# Patient Record
Sex: Male | Born: 1950 | Race: White | Hispanic: No | Marital: Married
Health system: Northeastern US, Academic
[De-identification: ages and names within clinical notes are randomized; demographics above are authoritative.]

---

## 2015-10-08 ENCOUNTER — Ambulatory Visit

## 2018-02-28 ENCOUNTER — Ambulatory Visit

## 2018-03-22 ENCOUNTER — Ambulatory Visit

## 2018-03-22 NOTE — Progress Notes (Signed)
Visit Type:  Follow-up  Primary Provider:  Dr. Valetta Mole      History of Present Illness:  Jerry Mercer is a 67 year-old male with a past medical history of SVT s/p ablation in 1996 at [facility], patient states he has a history of afib post, vitamin B12 deficiency and white coat hypertension.     He was previously followed by Dr. Liane Comber but has not been seen in our office since 02/24/2015.     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     Patient denies any hospitalizations or new medical problems the past 3 years.    In February 2019 he noticed he was having an increase in his premature contractions, mostly occurring 1/2 hour after eating and sometimes lasting 2-3 hours. He adjusted his diet and added magnesium and fish oil daily and he felt that helped with the symptoms.    In September 2019 he started experiencing an increase in his premature ventricular contractions again, either happening before or after meals, can happen when he is hungry or after he has had something to eat. When he belches the symptoms improve, symptoms also improve with Tums. Sometimes he notices middle back pain during the PVC's.     He denies chest pain, palpitations, shortness of breath, PND or lower leg edema, lightheadedness, dizziness, near syncope, syncope, TIA or stroke like symptoms.     He is physically active. He walks 5 miles a day, goes to the gym and golfs - all without exertional symptoms.     Patient was recently seen by his primary care physician, Dr. Valetta Mole in [place], [place], and states his blood work was okay with the exception of his cholesterol levels. We do not have labs available. We are requesting them. The patient states that his good cholesterol was elevated.    He also notes that he has a longstanding history of white coat hypertension. His home blood pressures run anywhere from 107/80  to 142/92 with an average blood pressure of 110-120/70's-80's. During his most recent physician visit, his office blood pressure was 157/94.    His home machine has been tested and is accurate.    He had a recent EKG on 03/11/2018 at his physician's office which showed sinus rhythm 68 bpm, no ectopy, normal axis, intraventricular conduction delay.     Current Problems- Reviewed during today's visit  DRY EYE SYNDROME  EXAMINATION OF EYES AND VISION  LEUKOCYTOPENIA NOS  VITAMIN B12 DEFICIENCY BORDERLINE  ATRIAL FIBRILLATION SP RF ABLATION  ATRIAL ARRHYTHMIAS    Current Medications- Reviewed during today's visit  ATENOLOL 50 MG ORAL TABLET: 37.5 mg by mouth daily  ATIVAN 1 MG ORAL TABLET (LORAZEPAM): 1/2 tablet as needed before flying  VITAMIN B-12 100 MCG ORAL TABLET (CYANOCOBALAMIN): one  by mouth each day  VIAGRA 100 MG ORAL TABLET (SILDENAFIL CITRATE): 1/2 - 1 by mouth daily as needed 1 hour prior to intercourse  EQL FISH OIL CAPSULE (OMEGA-3 FATTY ACIDS CAPS):     Current Allergies- Reviewed during today's visit  NO KNOWN ALLERGIES    Family History  + fam h/x of macular degeneration    Risk Factors  Tobacco User: no  Smoking Status: never smoked  Drug use: no  Alcohol use: yes  Average drink(s) per day: social      Alcohol Comments:  3 drinks a week    Exercise:  Yes  Type: walks  Times per week: 7  Exercise Comments:  5 miles daily, golf, gym        Review of Systems   General: Denies fever, chills, sweats, anorexia, fatigue, weakness, malaise, weight loss.   Eyes: Denies visual change or blurring.   Cardiovascular: Denies chest pain or pressure, palpitations, shortness of breath, orthopnea, PND, near syncope or syncope, fatigue. Feels PVC's  Respiratory: Denies dry cough, productive cough, shortness of breath, wheezing.   Gastrointestinal: Denies abdominal pain.   Musculoskeletal: Denies muscle cramps or aches.   Skin: Denies rash.   Heme/Lymphatic: Denies unusual bleeding.     Vital Signs     Patient: 67 Years  Old Male  Height:  70.5 in.  Weight: 190 lbs      Wt Chg: 3 since 02/24/2015  BMI:  26.97        26.55 on 02/24/2015  BP:  173/90     142/78 on 02/24/2015   Pulse:  76         Resp:  18  Pulse Ox: 98 %    Medications and Allergies Reviewed          Physical Exam    General:      well developed, well nourished, in no acute distress.    Neck:      no bruit, no JVD  Lungs:      clear bilaterally to auscultation.    Heart:      non-displaced PMI, chest non-tender; regular rate and rhythm, S1, S2 without murmurs, rubs, or gallops  Abdomen:       normal bowel sounds; no hepatosplenomegaly no ventral,umbilical hernias or masses noted.    Pulses:      pulses normal in all 4 extremities.    Extremities:      no clubbing, cyanosis, edema, or deformity noted with normal full range of motion of all joints.    Neurologic:      no focal deficits  Skin:      intact without lesions or rashes.    Psych:      alert and cooperative; normal mood and affect; normal attention span and concentration.      Test Results:     WBC:     5.5    (02/09/2015)  Hemoglobin:    15.6    (02/09/2015)  Hematocrit:    47.1    (02/09/2015)  MCV:     86.9    (02/09/2015)  MCH:     28.8    (02/09/2015)  PLatelets:    182    (02/09/2015)  Vitamin B12:    665    (02/09/2015)  TSH Ult:    1.31    (02/09/2015)  Total Prot:    7.1    (02/09/2015)  Albumin:    4.7    (02/09/2015)  AST:     19    (02/09/2015)  ALT:     18    (02/09/2015)  ALK PHOS:    89    (02/09/2015)  Total Bilirubin:   0.7    (02/09/2015)  Total Cholesterol:   251    (02/09/2015)  LDL:     148    (02/09/2015)  HDL:     82    (02/09/2015)  Non HDL:    169    02/09/2015)  Triglycerides:    106    (02/09/2015)  Cardiac CRP:    0.7 mg/L    (01/29/2014)  Glucose:  96    (02/09/2015)  Sodium:    142    (02/09/2015)  Potassium:    4.2    (02/09/2015)  Chloride:    102    (02/09/2015)  Bicarbonate:    25    (02/09/2015)  BUN:     20    (02/09/2015)  Creatinine:    1.1    (02/09/2015)  eGFR:     68     (02/09/2015)  Serum Calcium:   9.7    (02/09/2015)  Colonoscopy:    Complete    (08/19/2012)  PSA:     2.12    (01/29/2014)       Assessment and Plan:     1.  Premature ventricular contractions  The patient's heart rate on examination was regular with an occasional skipped beat. Denies palpitations, lightheadedness, dizziness or syncope. Patient had recent blood work.   -Requesting recent blood work  -MCOT 2 week monitor to evaluate his ectopy.   -He should continue on Atenolol 37.5 mg daily for now.     2.  White coat hypertension  Blood pressure elevated today in the office 173/90. Average blood pressure at home 110-120/70's-80's. Blood pressure cuff was correlating at last MD visit.     KM/ndp    Med Compliance and SE's: Pt is compliant with meds with no side effects   Patient/Caregiver understand instructions and plan.    Medications Removed Today:   ASPIRIN 325 MG ORAL TABLET (ASPIRIN) take one by mouth daily  RESTASIS 0.05 % OPHTHALMIC EMULSION (CYCLOSPORINE) one drop ou two times daily  supplly three months; Route: OPHTHALMIC    Changes to Medication List Documented Today:   ATENOLOL 50 MG ORAL TABLET (ATENOLOL) 37.5 mg by mouth daily  EQL FISH OIL CAPSULE (OMEGA-3 FATTY ACIDS CAPS)             Patient was seen Independently by Bary Richard NP. Dr. Meredith Leeds was immediately available during encounter.

## 2018-04-06 ENCOUNTER — Ambulatory Visit

## 2018-04-11 ENCOUNTER — Ambulatory Visit

## 2018-04-17 ENCOUNTER — Ambulatory Visit: Admitting: Nurse Practitioner

## 2018-04-17 NOTE — Telephone Encounter (Signed)
Phone Note -     Outgoing Call    Initial call taken by: Bary Richard NP,  April 17, 2018 5:31 PM  Call placed to: Patient  Detail: MCT results provided  Summary of Call: MCT: unifocal PVC's, no afib or sustained beats. Okay for pt to trial atenolol to 50 mg a day and f/u with Dr/ Turissini in 2 weeks.     Action Taken: Phone Call Completed

## 2018-05-01 ENCOUNTER — Ambulatory Visit

## 2018-05-01 NOTE — Progress Notes (Signed)
Primary Provider:  Dr. Valetta Mole      History of Present Illness:  He returns for followup of symptomatic ventricular ectopy. He saw my nurse practitioner last month and was having more symptomatic PVC's. We increased Atenolol to 50 mg daily and he has been on magnesium since February when this started happening. It has been cyclical and seems to be related to meals.       Current Problems- Reviewed during today's visit  DRY EYE SYNDROME  EXAMINATION OF EYES AND VISION  LEUKOCYTOPENIA NOS  VITAMIN B12 DEFICIENCY BORDERLINE  ATRIAL FIBRILLATION SP RF ABLATION  ATRIAL ARRHYTHMIAS    Current Medications- Reviewed during today's visit  METOPROLOL SUCCINATE ER 50 MG ORAL TABLET EXTENDED RELEASE 2: 1 by mouth each day  ATIVAN 1 MG ORAL TABLET (LORAZEPAM): 1/2 tablet as needed before flying  VITAMIN B-12 100 MCG ORAL TABLET (CYANOCOBALAMIN): one  by mouth each day  VIAGRA 100 MG ORAL TABLET (SILDENAFIL CITRATE): 1/2 - 1 by mouth daily as needed 1 hour prior to intercourse  EQL FISH OIL CAPSULE (OMEGA-3 FATTY ACIDS CAPS):     Current Allergies- Reviewed during today's visit  NO KNOWN ALLERGIES    Family History  + fam h/x of macular degeneration    Risk Factors  Smoking Status: never smoked  Drug use: no  Alcohol use: yes  Average drink(s) per day: social      Alcohol Comments:  3 drinks a week    Exercise: Yes  Type: walks  Times per week: 7  Exercise Comments:  5 miles daily, golf, gym          Vital Signs     Patient: 67 Years Old Male  Height:  70.5 in.  Prev Weight:  190 lbs   BMI: 26.97 on 03/22/2018   BP:  136/90     173/90 on 03/22/2018   Pulse:  84         Pulse Ox: 98 %    Medications and Allergies Reviewed          Physical Exam    Neck:      +2 carotids, no bruits.   Lungs:      clear.  Heart:      regular rhythm, S1, S2, no murmurs appreciated.   Extremities:      no edema.          Assessment and Plan:     Symptomatic PVC's. The patient had a 2 week MCOT. He had symptomatic PVC's. His symptoms correlated  with PVC's on the monitor. On different days they are cyclical. Some days he has very few PVC's but other days he had over 500. I had a long discussion with the patient. I will try switching him to metoprolol 50 mg daily for a little more membrane stabilization and perhaps better 24 hour coverage. If that doesn't work, we can do Flecainide. With Flecainide I would want to do a stress test first. He is going to [place] and will return in 6 months. I will see him back when he returns.     CJT/ndp       Changes to Medication List Documented Today:   METOPROLOL SUCCINATE ER 50 MG ORAL TABLET EXTENDED RELEASE 2 (METOPROLOL SUCCINATE) 1 by mouth each day; Route: ORAL        Medications:  METOPROLOL SUCCINATE ER 50 MG ORAL TABLET EXTENDED RELEASE 2 (METOPROLOL SUCCINATE) 1 by mouth each day  #90[Tablet] x 3   Route:ORAL  Entered and Authorized by: Alford Highland MD   Signed by: Alford Highland MD on 05/01/2018   Method used: Electronically to      [id number] (retail)     [address]     [place], [place]  [address]     Ph: [phone]     Fax: [phone]   Note to Pharmacy: Route: ORAL;    RxID: [id number]

## 2018-05-03 ENCOUNTER — Ambulatory Visit

## 2018-05-03 NOTE — Telephone Encounter (Signed)
Phone Note -     Patient    Call back at Cell [phone]  Initial call taken by: Mignon Pine,  May 03, 2018 11:43 AM  Initial Details of Call:  pt was here wed and he and you discussed stopping Atenolol and starting Propanolol. when he picked up med at pharmacy it was Metoprolol.  he's not sure what he should be taking      Follow-up #1  Details: discussed with him  hte medicine is metoprolol  By: Alford Highland MD ~ May 03, 2018 6:54 PM

## 2018-12-05 ENCOUNTER — Ambulatory Visit

## 2018-12-05 NOTE — Progress Notes (Signed)
 Primary Provider:  Dr. Valetta Mole      History of Present Illness:  Jerry Mercer is a 68 year-old male with a past medical history of SVT s/p ablation in 1996 at [facility], patient states he has a history of afib post, vitamin B12 deficiency and white coat hypertension.     He was previously followed by Dr. Liane Comber but has not been seen in our office since 02/24/2015.     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     Patient denies any hospitalizations or new medical problems the past 3 years.    In February 2019 he noticed he was having an increase in his premature contractions, mostly occurring 1/2 hour after eating and sometimes lasting 2-3 hours. He adjusted his diet and added magnesium and fish oil daily and he felt that helped with the symptoms.    In September 2019 he started experiencing an increase in his premature ventricular contractions again, either happening before or after meals, can happen when he is hungry or after he has had something to eat. When he belches the symptoms improve, symptoms also improve with Tums. Sometimes he notices middle back pain during the PVC's.     He returns for followup of palpitations. He has a history of SVT s/p ablation and PAC's. He was switched from Atenolol to metoprolol and his palpitations are much better. He only gets a few months of fluttering intermittently for a few minutes, then it resolves. He has noted in the past 6 months that his blood pressure has gone up. It used to be in the 110-120 range and now runs between 136/90 to as high as 155-158/100 on his [employer] home machine. Pulse is usually 70-80. He drinks alcohol 2-3x/week, 2-3 drinks. He is not exercising as much as he used to due to the pandemic but was exercising before on an elliptical machine and treadmill. With that he noticed that his blood pressure improves. His weight is 191  pounds which is up a little bit. A few years ago his weight was in the mid-180's.       Current Problems- Reviewed during today's visit  DRY EYE SYNDROME (ICD10-H04.129)  EXAMINATION OF EYES AND VISION (ICD10-Z01.00)  LEUKOCYTOPENIA NOS (ICD10-D72.819)  VITAMIN B12 DEFICIENCY BORDERLINE (ICD10-E53.8)  ATRIAL FIBRILLATION SP RF ABLATION (ICD10-I48.91)  ATRIAL ARRHYTHMIAS (ICD10-I49.9)    Current Medications- Reviewed during today's visit  METOPROLOL SUCCINATE ER 50 MG ORAL TABLET EXTENDED RELEASE 2: 1 by mouth each day  ATIVAN 1 MG ORAL TABLET (LORAZEPAM): 1/2 tablet as needed before flying  VITAMIN B-12 100 MCG ORAL TABLET (CYANOCOBALAMIN): one  by mouth each day  VIAGRA 100 MG ORAL TABLET (SILDENAFIL CITRATE): 1/2 - 1 by mouth daily as needed 1 hour prior to intercourse    Current Allergies- Reviewed during today's visit  NO KNOWN ALLERGIES    Family History  + fam h/x of macular degeneration    Risk Factors  Smoking Status: never smoked  Drug use: no  Alcohol use: yes  Average drink(s) per day: social      Alcohol Comments:  3 drinks a week    Exercise: Yes  Type: walks  Times per week: 7  Exercise Comments:  5 miles daily, golf, gym          Vital Signs     Patient: 68 Years Old Male  Height:  70.5 in.  Weight: 190 lbs        BMI:  26.97        26.97 on 03/22/2018  BP:  145/94     136/90 on 05/01/2018   Temp:  98.1  F      Pulse:  72         Pulse Ox: 98 %    Medications and Allergies Reviewed            Physical Exam    Neck:      +2 carotids, full upstrokes, no bruits.   Lungs:      clear.  Heart:      regular rhythm, S1, S2   Extremities:      no edema.          Assessment and Plan:     1.  Palpitations. The patient has improved on metoprolol. He had an MCOT which showed no significant arrhythmias 6 months ago. Continue metoprolol.   2.  Hypertension. The patient will try to lose 5 pounds and exercise more. I went over a low salt diet with him including a 1 mg sodium/calorie ratio. He will call me in 2 months  with blood pressure readings. If they have not improved, will start him on an antihypertensive.     I will see him back in 4 months before he goes to [place].     CJT/ndp     cc:  Dr. Valetta Mole, [place], [place]      Medications Removed Today:   EQL FISH OIL CAPSULE (OMEGA-3 FATTY ACIDS CAPS)

## 2019-04-07 ENCOUNTER — Ambulatory Visit

## 2019-04-07 NOTE — Progress Notes (Signed)
 Primary Provider:  Dr. Valetta Mole      History of Present Illness:  He returns for followup of ventricular ectopy with symptomatic palpitations. He has a history of SVT in the past s/p ablation and a history of afib at the time of SVT ablation, then 1 other episode about a year later. With afib he was very symptomatic and felt unwell. He has had no problems since. He has had elevated blood pressure readings, mostly white coat hypertension. He brings in blood pressure readings from home. He has 30 readings, 2 were elevated just mildly, one was 140/92 and another was 145/88. The remainder were all in the normal range from a low of 108 systolic to a high of 128-130 systolic. He feels well. On metoprolol he has had minimal palpitations, about 90-95% improved. He plays pickle ball up to 4 hours in the hot sun and sometimes has elevated heart rate readings at about 101-110 but no error message on his machine. 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 He feels he may be dehydrated. These pickle ball games go on for 4 hours, sometimes in the hot sun. No chest pain, shortness of breath. He brings in blood work from his physician which shows cholesterol 218 and LDL of 134.       Current Problems- Reviewed during today's visit  DRY EYE SYNDROME (ICD10-H04.129)  EXAMINATION OF EYES AND VISION (ICD10-Z01.00)  LEUKOCYTOPENIA NOS (ICD10-D72.819)  VITAMIN B12 DEFICIENCY BORDERLINE (ICD10-E53.8)  ATRIAL FIBRILLATION SP RF ABLATION (ICD10-I48.91)  ATRIAL ARRHYTHMIAS (ICD10-I49.9)    Current Medications- Reviewed during today's visit  METOPROLOL SUCCINATE ER 50 MG ORAL TABLET EXTENDED RELEASE 2: 1 by mouth each day  ATIVAN 1 MG ORAL TABLET (LORAZEPAM): 1/2 tablet as needed before flying  VITAMIN B-12 100 MCG ORAL TABLET (CYANOCOBALAMIN): one  by mouth each day  VIAGRA 100 MG ORAL TABLET (SILDENAFIL CITRATE): 1/2 - 1 by mouth daily as needed 1 hour prior to intercourse    Current Allergies-  Reviewed during today's visit  NO KNOWN ALLERGIES    Family History  + fam h/x of macular degeneration    Risk Factors  Smoking Status: never smoked  Drug use: no  Alcohol use: yes  Average drink(s) per day: social      Alcohol Comments:  3 drinks a week    Exercise: Yes  Type: walks  Times per week: 7  Exercise Comments:  5 miles daily, golf, gym          Vital Signs     Patient: 68 Years Old Male  Height:  70.5 in.  Weight: 182 lbs      Wt Chg: -8 since 12/05/2018  BMI:  25.84        26.97 on 12/05/2018  BP:  156/88     145/94 on 12/05/2018   Temp:  97.11  F      Pulse:  80       regular    Medications and Allergies Reviewed            Physical Exam    Neck:      +2 carotids, no bruits.   Lungs:      clear.  Heart:      regular rhythm, S1, S2, no murmurs appreciated.   Extremities:      no edema.          Assessment and Plan:     1.  Hypertension.  The patient's blood pressures at home are > 90% in the normal range. There is no need to increase his antihypertensive.  2.  Palpitations. Markedly improved with metoprolol. He is tolerating it well without side effects.  3.  History of atrial fibrillation. The patient has had no symptomatic recurrence. His elevated readings after pickle ball are probably sinus. Recommend he stays more hydrated.    I will see him back in 1 year, sooner if any symptoms warrant.     CJT/ndp     cc:  Dr. Valetta Mole, [place], [place]

## 2019-04-30 ENCOUNTER — Ambulatory Visit

## 2019-10-17 ENCOUNTER — Ambulatory Visit

## 2020-04-06 ENCOUNTER — Ambulatory Visit

## 2020-04-06 NOTE — Progress Notes (Signed)
 [facility]   [address]  [place], [place] [address]  Office: [phone] Fax: [phone]  Active Medication List and Instructions  April 06, 2020     Jerry Mercer  [birth year]    1)  METOPROLOL SUCCINATE ER 50 MG ORAL TABLET EXTENDED RELEASE 2 (METOPROLOL SUCCINATE) 1 by mouth each day; Route: ORAL  2)  ATIVAN 1 MG ORAL TABLET (LORAZEPAM) 1/2 tablet as needed before flying  3)  VITAMIN B-12 100 MCG ORAL TABLET (CYANOCOBALAMIN) one  by mouth each day  4)  VIAGRA 100 MG ORAL TABLET (SILDENAFIL CITRATE) 1/2 - 1 by mouth daily as needed 1 hour prior to intercourse; Route: ORAL    Allergies:    Abbreviations:    bid= twice a day   Q= per  D= day    QD= per day  HS= bed time   QID= 4 times a day  OTC= over the counter  QOD= every other day  PO= by mouth   TID= three times a day  PRN= as needed   TID= 3 times a day

## 2020-04-07 ENCOUNTER — Ambulatory Visit

## 2020-04-07 NOTE — Progress Notes (Signed)
 Office Visit    04/07/2020  [facility]     Lamarr Lulas, MD  Cardiology   Progress Notes  Lamarr Lulas, MD (Physician) ? ? Cardiology     ?  [facility] Office Visit  ?  ?  History of Present Illness:  ?  Jerry Mercer is a 69 y.o. male with a past medical history of SVT s/p ablation in 1996 at [facility], patient states he has a history of afib post, vitamin B12 deficiency and white coat hypertension.   ?  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   ?  Patient denies any hospitalizations or new medical problems the past 3 years.  ?  In February 2019 he noticed he was having an increase in his premature contractions, mostly occurring 1/2 hour after eating and sometimes lasting 2-3 hours. He adjusted his diet and added magnesium and fish oil daily and he felt that helped with the symptoms.  ?  In September 2019 he started experiencing an increase in his premature ventricular contractions again, either happening before or after meals, can happen when he is hungry or after he has had something to eat. When he belches the symptoms improve, symptoms also improve with Tums. Sometimes he notices middle back pain during the PVC's.   ?  Since being placed on metoprolol he is really had really no palpitations to speak of.  Maybe once a month he has some palpitations not like multiple times a day before metoprolol.  ?  He had a difficult year.  He is diagnosed with having polymyalgia rheumatica and urinary retention when sciatica due to a spinal cyst which was drained.  He is finally doing better.  ?  He takes his blood pressures at home he has occasional high readings very rarely over 140 mostly ranges between 119-134/83-84  ?  Vital signs:  BP 134/76   Pulse 84   Ht 179.1 cm (5' 10.5")   Wt 86.2 kg (190 lb)   SpO2 98%   BMI 26.88 kg/m?   ?  Wt Readings from  Last 3 Encounters:    04/07/20  86.2 kg (190 lb)    ?  Physical Exam:  Neck: +2 carotids.  No bruits  Heart: Regular rhythm S1-2 no murmurs appreciated  Lungs: Clear  Extremities no edema  ?  EKG: I have personally reviewed the EKG from 04/07/2020 which shows normal sinus rhythm rate of 80 essentially normal ECG  ?  ?  Assessment/Plan:  ?  Paroxysmal atrial fibrillation  He is status post PVI.  No recurrent A. fib.  ?  PVC's (premature ventricular contractions)  Markedly improved metoprolol continue same medications see him back 1 year  ?  Primary hypertension  ?  Blood pressure at home is mostly controlled.  Only has a rare elevated reading.  He has whitecoat hypertension  ?  ?  Medications:  Patient's Medications    New Prescriptions    ?  No medications on file    Previous Medications    ?  ASPIRIN 325 MG TABLET     Take 325 mg by mouth daily.     ?  CYANOCOBALAMIN (VIT B-12) 1000 MCG TABLET     Take 100 mcg by mouth daily.    ?  LORAZEPAM (ATIVAN) 1 MG TABLET     Take 0.5 mg by mouth every  6 (six) hours as needed for anxiety.     ?  METOPROLOL SUCCINATE (TOPROL-XL) 50 MG 24 HR TABLET     Take 50 mg by mouth daily.    ?  PREDNISONE (DELTASONE) 1 MG TABLET     Take 6 mg by mouth daily with breakfast.    ?  SILDENAFIL (VIAGRA) 100 MG TABLET     Take 100 mg by mouth daily as needed for erectile dysfunction.    ?  TAMSULOSIN (FLOMAX) 0.4 MG CAP     Take 0.4 mg by mouth daily.    ?  TRAMADOL (ULTRAM) 50 MG TABLET     Take 50 mg by mouth every 6 (six) hours as needed for pain (specific location in comments).    Modified Medications    ?  No medications on file    Discontinued Medications    ?  ATENOLOL (TENORMIN) 50 MG TABLET     Take 50 mg by mouth daily.    ?  CYCLOSPORINE (RESTASIS) 0.05 % SUSPENSION     Place 1 drop into each eye 2 (two) times a day.    ?  ?  Follow up:  No follow-ups on file.  ?  Scarbro Palau Turissini, MD  ?  ?       I ?PHS AMB PRE VISIT PLANNING       B   None  Medication Changes            Patient not taking:  Reported on 04/07/2020     Medication List   Visit Diagnoses       Paroxysmal atrial fibrillation     PVC's (premature ventricular contractions)     Primary hypertension     Polymyalgia rheumatica     Urinary retention due to benign prostatic hyperplasia     Spinal cord cysts    Visit note  prepared and signed by Alford Highland, M.D. and uploaded to EMR        Problem List

## 2020-09-07 NOTE — Progress Notes (Signed)
Primary Provider:  Dr. Valetta Mole      History of Present Illness:  He returns for followup of symptomatic ventricular ectopy. He saw my nurse practitioner last month and was having more symptomatic PVC's. We increased Atenolol to 50 mg daily and he has been on magnesium since February when this started happening. It has been cyclical and seems to be related to meals.       Current Problems- Reviewed during today's visit  DRY EYE SYNDROME  EXAMINATION OF EYES AND VISION  LEUKOCYTOPENIA NOS  VITAMIN B12 DEFICIENCY BORDERLINE  ATRIAL FIBRILLATION SP RF ABLATION  ATRIAL ARRHYTHMIAS    Current Medications- Reviewed during today's visit  METOPROLOL SUCCINATE ER 50 MG ORAL TABLET EXTENDED RELEASE 2: 1 by mouth each day  ATIVAN 1 MG ORAL TABLET (LORAZEPAM): 1/2 tablet as needed before flying  VITAMIN B-12 100 MCG ORAL TABLET (CYANOCOBALAMIN): one  by mouth each day  VIAGRA 100 MG ORAL TABLET (SILDENAFIL CITRATE): 1/2 - 1 by mouth daily as needed 1 hour prior to intercourse  EQL FISH OIL CAPSULE (OMEGA-3 FATTY ACIDS CAPS):     Current Allergies- Reviewed during today's visit  NO KNOWN ALLERGIES    Family History  + fam h/x of macular degeneration    Risk Factors  Smoking Status: never smoked  Drug use: no  Alcohol use: yes  Average drink(s) per day: social      Alcohol Comments:  3 drinks a week    Exercise: Yes  Type: walks  Times per week: 7  Exercise Comments:  5 miles daily, golf, gym          Vital Signs     Patient: 70 Years Old Male  Height:  70.5 in.  Prev Weight:  190 lbs   BMI: 26.97 on 03/22/2018   BP:  136/90     173/90 on 03/22/2018   Pulse:  84         Pulse Ox: 98 %    Medications and Allergies Reviewed          Physical Exam    Neck:      +2 carotids, no bruits.   Lungs:      clear.  Heart:      regular rhythm, S1, S2, no murmurs appreciated.   Extremities:      no edema.          Assessment and Plan:     Symptomatic PVC's. The patient had a 2 week MCOT. He had symptomatic PVC's. His symptoms correlated  with PVC's on the monitor. On different days they are cyclical. Some days he has very few PVC's but other days he had over 500. I had a long discussion with the patient. I will try switching him to metoprolol 50 mg daily for a little more membrane stabilization and perhaps better 24 hour coverage. If that doesn't work, we can do Flecainide. With Flecainide I would want to do a stress test first. He is going to [place] and will return in 6 months. I will see him back when he returns.     CJT/ndp       Changes to Medication List Documented Today:   METOPROLOL SUCCINATE ER 50 MG ORAL TABLET EXTENDED RELEASE 2 (METOPROLOL SUCCINATE) 1 by mouth each day; Route: ORAL        Medications:  METOPROLOL SUCCINATE ER 50 MG ORAL TABLET EXTENDED RELEASE 2 (METOPROLOL SUCCINATE) 1 by mouth each day  #90[Tablet] x 3   Route:ORAL  Entered and Authorized by: Alford Highland MD   Signed by: Alford Highland MD on 05/01/2018   Method used: Electronically to      [id number] (retail)     [address]     [place], [place]  [address]     Ph: [phone]     Fax: [phone]   Note to Pharmacy: Route: ORAL;    RxID: [id number]

## 2020-09-07 NOTE — Progress Notes (Signed)
Visit Type:  Follow-up  Primary Provider:  Dr. Valetta Mole      History of Present Illness:  Jerry Mercer is a 70 year-old male with a past medical history of SVT s/p ablation in 1996 at [facility], patient states he has a history of afib post, vitamin B12 deficiency and white coat hypertension.     He was previously followed by Dr. Liane Comber but has not been seen in our office since 02/24/2015.     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     Patient denies any hospitalizations or new medical problems the past 3 years.    In February 2019 he noticed he was having an increase in his premature contractions, mostly occurring 1/2 hour after eating and sometimes lasting 2-3 hours. He adjusted his diet and added magnesium and fish oil daily and he felt that helped with the symptoms.    In September 2019 he started experiencing an increase in his premature ventricular contractions again, either happening before or after meals, can happen when he is hungry or after he has had something to eat. When he belches the symptoms improve, symptoms also improve with Tums. Sometimes he notices middle back pain during the PVC's.     He denies chest pain, palpitations, shortness of breath, PND or lower leg edema, lightheadedness, dizziness, near syncope, syncope, TIA or stroke like symptoms.     He is physically active. He walks 5 miles a day, goes to the gym and golfs - all without exertional symptoms.     Patient was recently seen by his primary care physician, Dr. Valetta Mole in [place], [place], and states his blood work was okay with the exception of his cholesterol levels. We do not have labs available. We are requesting them. The patient states that his good cholesterol was elevated.    He also notes that he has a longstanding history of white coat hypertension. His home blood pressures run anywhere from 107/80  to 142/92 with an average blood pressure of 110-120/70's-80's. During his most recent physician visit, his office blood pressure was 157/94.    His home machine has been tested and is accurate.    He had a recent EKG on 03/11/2018 at his physician's office which showed sinus rhythm 68 bpm, no ectopy, normal axis, intraventricular conduction delay.     Current Problems- Reviewed during today's visit  DRY EYE SYNDROME  EXAMINATION OF EYES AND VISION  LEUKOCYTOPENIA NOS  VITAMIN B12 DEFICIENCY BORDERLINE  ATRIAL FIBRILLATION SP RF ABLATION  ATRIAL ARRHYTHMIAS    Current Medications- Reviewed during today's visit  ATENOLOL 50 MG ORAL TABLET: 37.5 mg by mouth daily  ATIVAN 1 MG ORAL TABLET (LORAZEPAM): 1/2 tablet as needed before flying  VITAMIN B-12 100 MCG ORAL TABLET (CYANOCOBALAMIN): one  by mouth each day  VIAGRA 100 MG ORAL TABLET (SILDENAFIL CITRATE): 1/2 - 1 by mouth daily as needed 1 hour prior to intercourse  EQL FISH OIL CAPSULE (OMEGA-3 FATTY ACIDS CAPS):     Current Allergies- Reviewed during today's visit  NO KNOWN ALLERGIES    Family History  + fam h/x of macular degeneration    Risk Factors  Tobacco User: no  Smoking Status: never smoked  Drug use: no  Alcohol use: yes  Average drink(s) per day: social      Alcohol Comments:  3 drinks a week    Exercise:  Yes  Type: walks  Times per week: 7  Exercise Comments:  5 miles daily, golf, gym        Review of Systems   General: Denies fever, chills, sweats, anorexia, fatigue, weakness, malaise, weight loss.   Eyes: Denies visual change or blurring.   Cardiovascular: Denies chest pain or pressure, palpitations, shortness of breath, orthopnea, PND, near syncope or syncope, fatigue. Feels PVC's  Respiratory: Denies dry cough, productive cough, shortness of breath, wheezing.   Gastrointestinal: Denies abdominal pain.   Musculoskeletal: Denies muscle cramps or aches.   Skin: Denies rash.   Heme/Lymphatic: Denies unusual bleeding.     Vital Signs     Patient: 70 Years  Old Male  Height:  70.5 in.  Weight: 190 lbs      Wt Chg: 3 since 02/24/2015  BMI:  26.97        26.55 on 02/24/2015  BP:  173/90     142/78 on 02/24/2015   Pulse:  76         Resp:  18  Pulse Ox: 98 %    Medications and Allergies Reviewed          Physical Exam    General:      well developed, well nourished, in no acute distress.    Neck:      no bruit, no JVD  Lungs:      clear bilaterally to auscultation.    Heart:      non-displaced PMI, chest non-tender; regular rate and rhythm, S1, S2 without murmurs, rubs, or gallops  Abdomen:       normal bowel sounds; no hepatosplenomegaly no ventral,umbilical hernias or masses noted.    Pulses:      pulses normal in all 4 extremities.    Extremities:      no clubbing, cyanosis, edema, or deformity noted with normal full range of motion of all joints.    Neurologic:      no focal deficits  Skin:      intact without lesions or rashes.    Psych:      alert and cooperative; normal mood and affect; normal attention span and concentration.      Test Results:     WBC:     5.5    (02/09/2015)  Hemoglobin:    15.6    (02/09/2015)  Hematocrit:    47.1    (02/09/2015)  MCV:     86.9    (02/09/2015)  MCH:     28.8    (02/09/2015)  PLatelets:    182    (02/09/2015)  Vitamin B12:    665    (02/09/2015)  TSH Ult:    1.31    (02/09/2015)  Total Prot:    7.1    (02/09/2015)  Albumin:    4.7    (02/09/2015)  AST:     19    (02/09/2015)  ALT:     18    (02/09/2015)  ALK PHOS:    89    (02/09/2015)  Total Bilirubin:   0.7    (02/09/2015)  Total Cholesterol:   251    (02/09/2015)  LDL:     148    (02/09/2015)  HDL:     82    (02/09/2015)  Non HDL:    169    02/09/2015)  Triglycerides:    106    (02/09/2015)  Cardiac CRP:    0.7 mg/L    (01/29/2014)  Glucose:  96    (02/09/2015)  Sodium:    142    (02/09/2015)  Potassium:    4.2    (02/09/2015)  Chloride:    102    (02/09/2015)  Bicarbonate:    25    (02/09/2015)  BUN:     20    (02/09/2015)  Creatinine:    1.1    (02/09/2015)  eGFR:     68     (02/09/2015)  Serum Calcium:   9.7    (02/09/2015)  Colonoscopy:    Complete    (08/19/2012)  PSA:     2.12    (01/29/2014)       Assessment and Plan:     1.  Premature ventricular contractions  The patient's heart rate on examination was regular with an occasional skipped beat. Denies palpitations, lightheadedness, dizziness or syncope. Patient had recent blood work.   -Requesting recent blood work  -MCOT 2 week monitor to evaluate his ectopy.   -He should continue on Atenolol 37.5 mg daily for now.     2.  White coat hypertension  Blood pressure elevated today in the office 173/90. Average blood pressure at home 110-120/70's-80's. Blood pressure cuff was correlating at last MD visit.     KM/ndp    Med Compliance and SE's: Pt is compliant with meds with no side effects   Patient/Caregiver understand instructions and plan.    Medications Removed Today:   ASPIRIN 325 MG ORAL TABLET (ASPIRIN) take one by mouth daily  RESTASIS 0.05 % OPHTHALMIC EMULSION (CYCLOSPORINE) one drop ou two times daily  supplly three months; Route: OPHTHALMIC    Changes to Medication List Documented Today:   ATENOLOL 50 MG ORAL TABLET (ATENOLOL) 37.5 mg by mouth daily  EQL FISH OIL CAPSULE (OMEGA-3 FATTY ACIDS CAPS)             Patient was seen Independently by Bary Richard NP. Dr. Meredith Leeds was immediately available during encounter.

## 2020-09-07 NOTE — Teleconsult (Signed)
Phone Note -     Patient    Call back at Cell [phone]  Initial call taken by: Mignon Pine,  May 03, 2018 11:43 AM  Initial Details of Call:  pt was here wed and he and you discussed stopping Atenolol and starting Propanolol. when he picked up med at pharmacy it was Metoprolol.  he's not sure what he should be taking      Follow-up #1  Details: discussed with him  hte medicine is metoprolol  By: Alford Highland MD ~ May 03, 2018 6:54 PM

## 2020-09-07 NOTE — Teleconsult (Signed)
Phone Note -     Outgoing Call    Initial call taken by: Bary Richard NP,  April 17, 2018 5:31 PM  Call placed to: Patient  Detail: MCT results provided  Summary of Call: MCT: unifocal PVC's, no afib or sustained beats. Okay for pt to trial atenolol to 50 mg a day and f/u with Dr/ Turissini in 2 weeks.     Action Taken: Phone Call Completed

## 2023-03-27 IMAGING — DX LUMBAR SPINE AP, LAT WITH FLEXION AND EXTEN
4 series · 4 of 4 positions shown · non-contrast
Comparison: None.

________________________________________________________________________________________________ 
LUMBAR SPINE AP, LAT WITH FLEXION AND EXTEN, 03/27/2023 [DATE]: 
CLINICAL INDICATION: Radiculopathy, lumbar region

[AP]
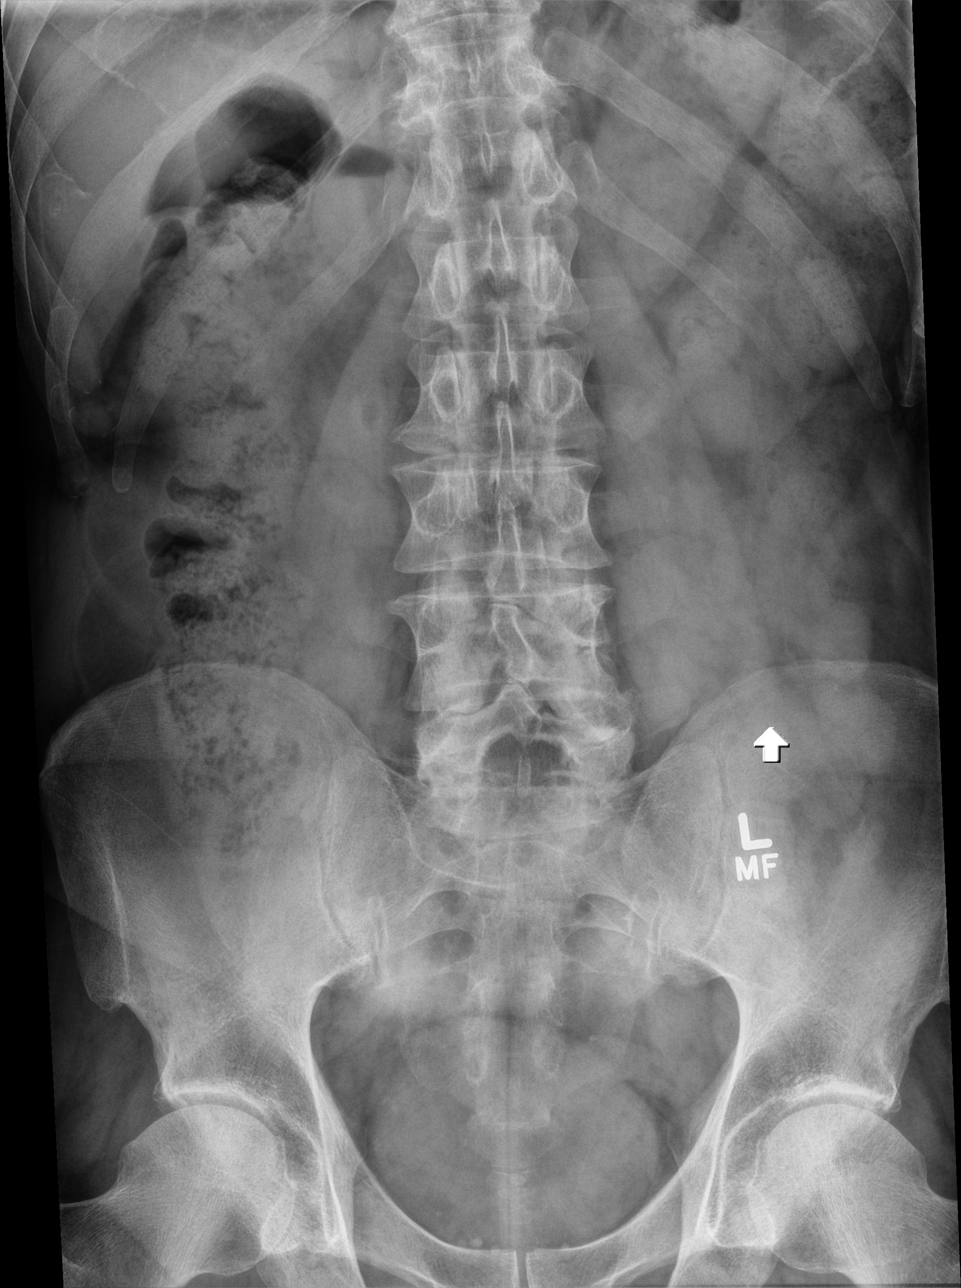

[lateral]
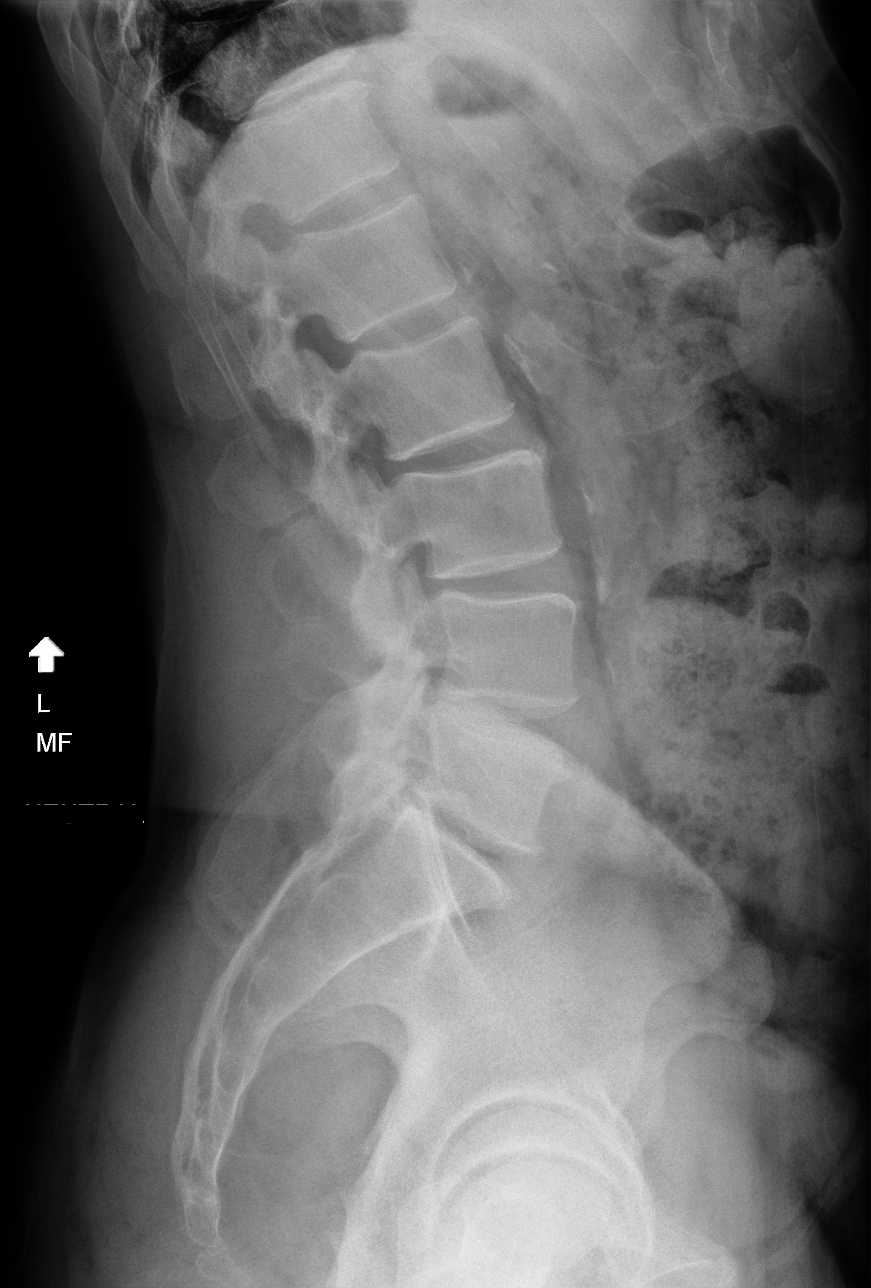

[lateral flex]
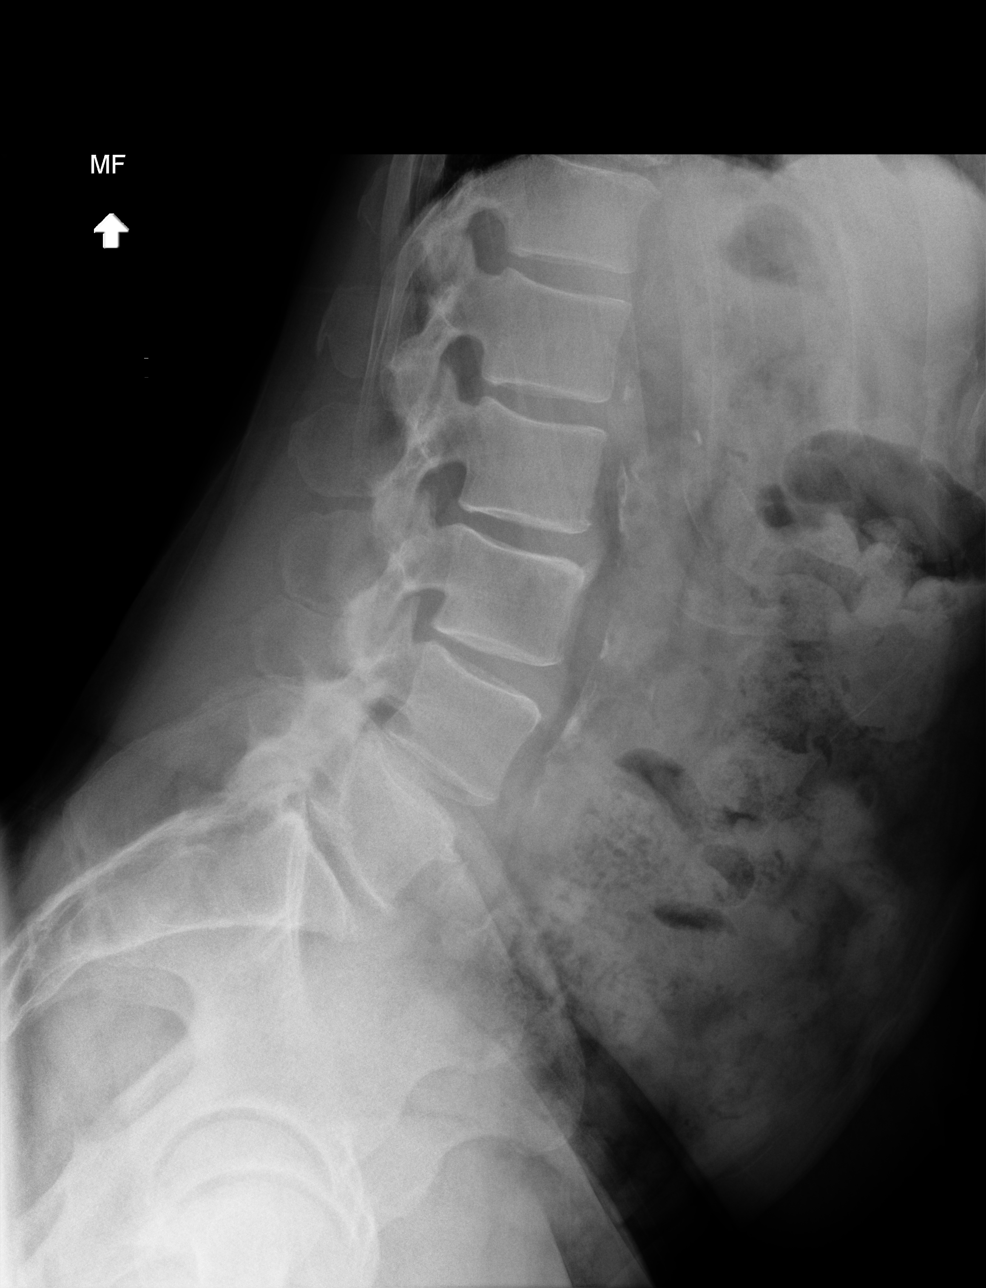

[lateral ext]
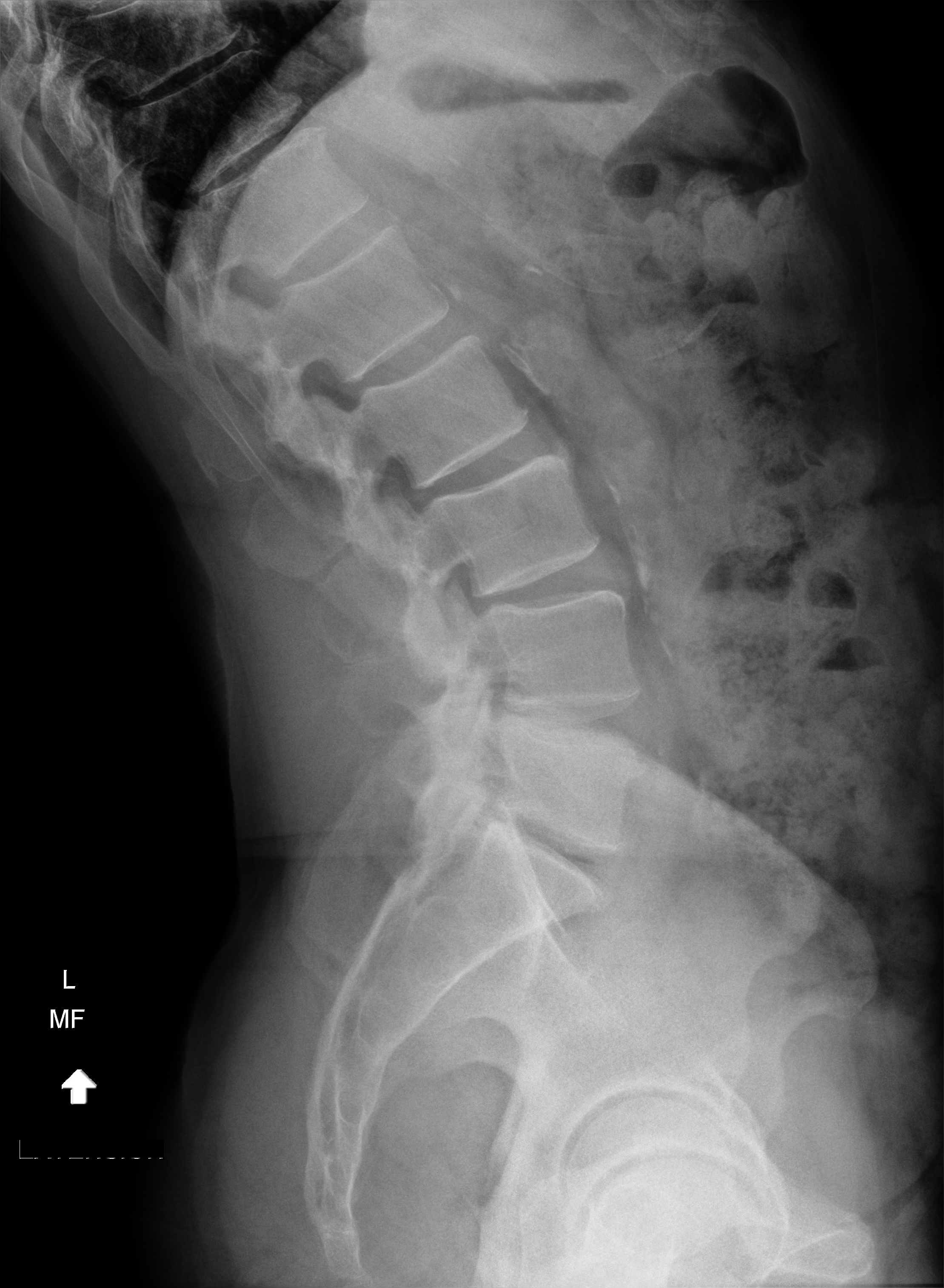

[4 of 4 positions shown; findings below may reference images not displayed]

FINDINGS: No acute fracture. No focal suspect lytic or blastic lesion. There are scattered 
discogenic/degenerative changes.  No dynamic instability on flexion/extension 
views.   Visualized extraspinal soft tissues and osseous structures show no 
acute abnormality. Vascular calcifications.
IMPRESSION: No acute abnormality. Degenerative changes without dynamic instability.

## 2023-03-28 IMAGING — MR MRI LUMBAR SPINE WITHOUT CONTRAST
4 of 8 series · 8 of 48 positions shown · IV contrast (gadolinium)
Comparison: None.

________________________________________________________________________________________________ 
MRI LUMBAR SPINE WITHOUT CONTRAST, 03/28/2023 [DATE]: 
CLINICAL INDICATION: Low back and left lower extremity pain down to knee.
TECHNIQUE: Multiplanar, multiecho position MR images of the lumbar spine were 
performed without intravenous gadolinium enhancement. Patient was scanned on a 
3T magnet

[Series 101: survey · axial · 10.0mm · 1.39mm/px · z∈[-15,+199]mm · 3 of 9 slices shown]
[im 1/9]
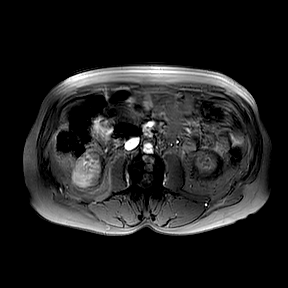
[im 5/9]
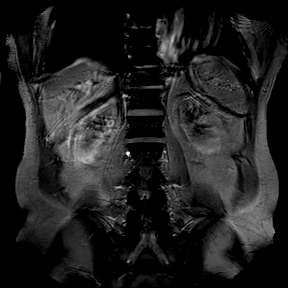
[im 9/9]
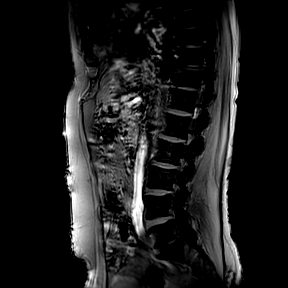

[Series 201: t2w_cor-surv · coronal · 6.0mm · 0.50mm/px · 1 of 5 slices shown]
[im 1/5]
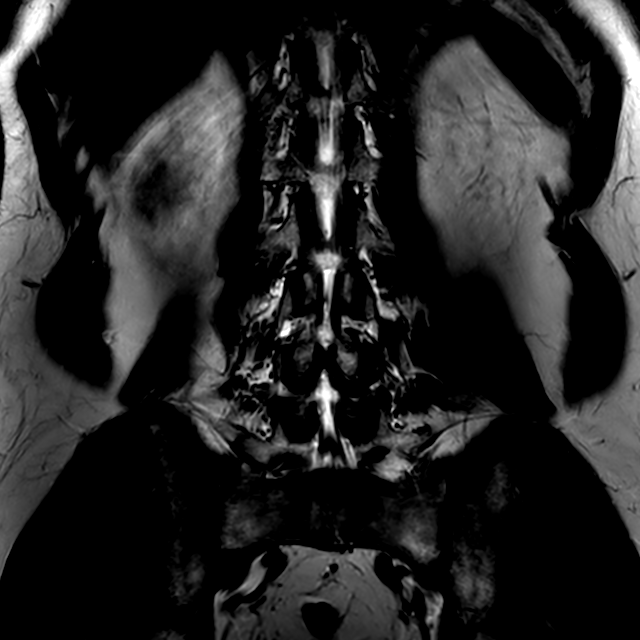

[Series 301: t1w_tse sag · sagittal · 4.0mm · 0.25mm/px · 3 of 17 slices shown]
[im 1/17]
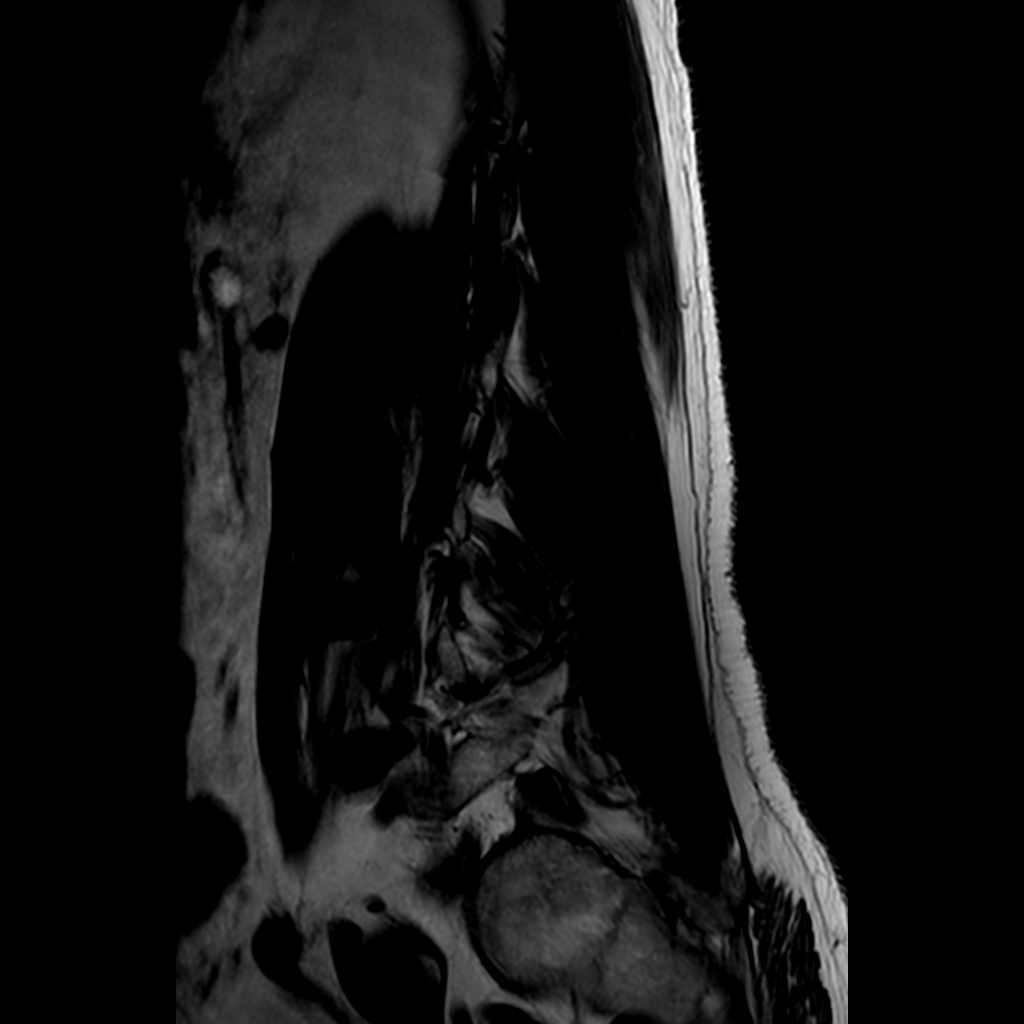
[im 9/17]
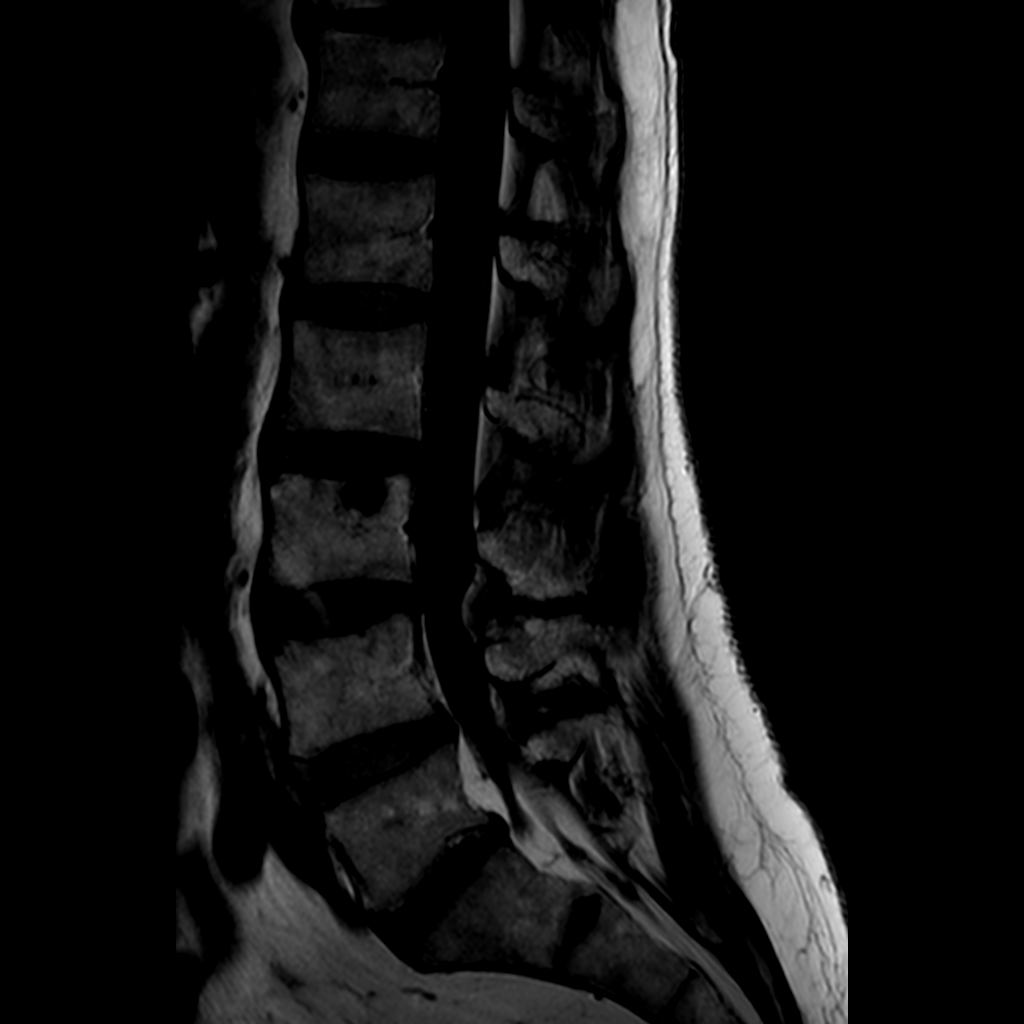
[im 17/17]
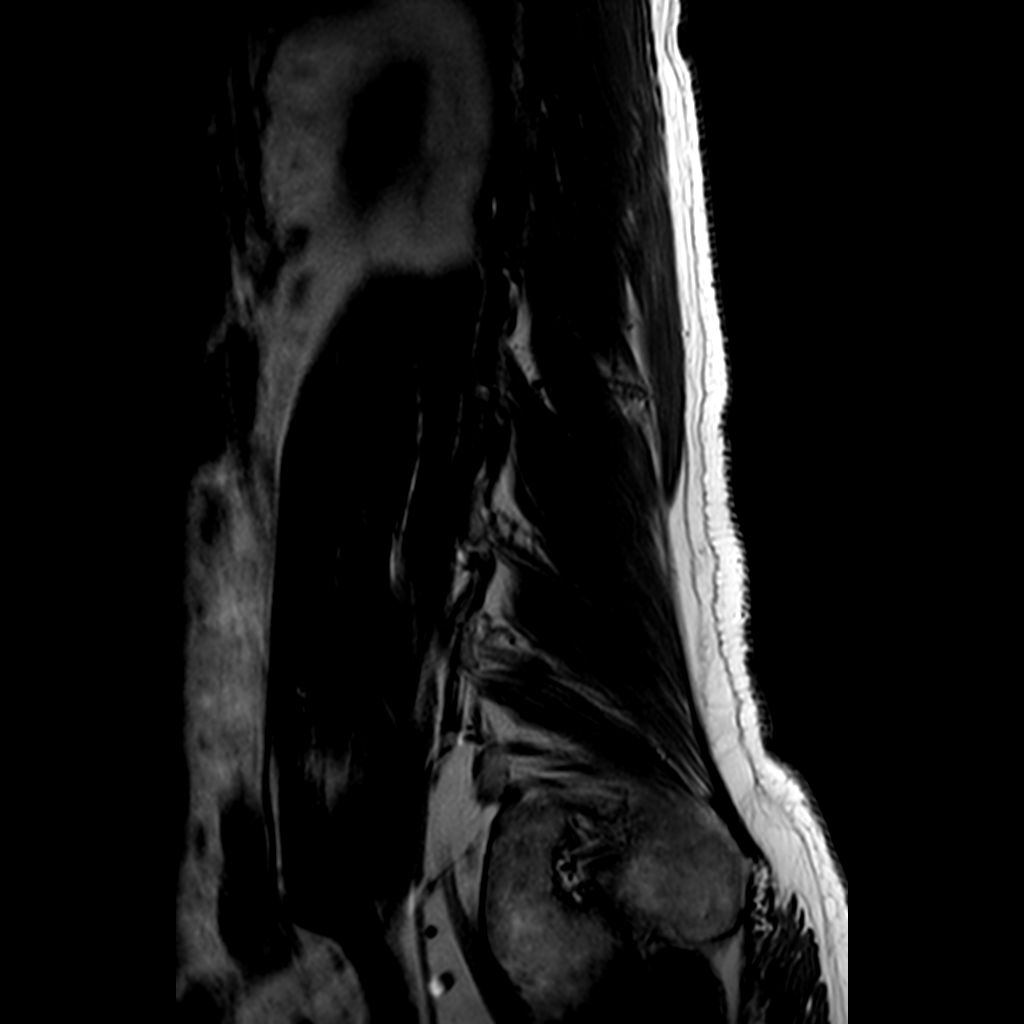

[Series 401: t2w_tse sag · sagittal · 4.0mm · 0.26mm/px · 1 of 17 slices shown]
[im 1/17]
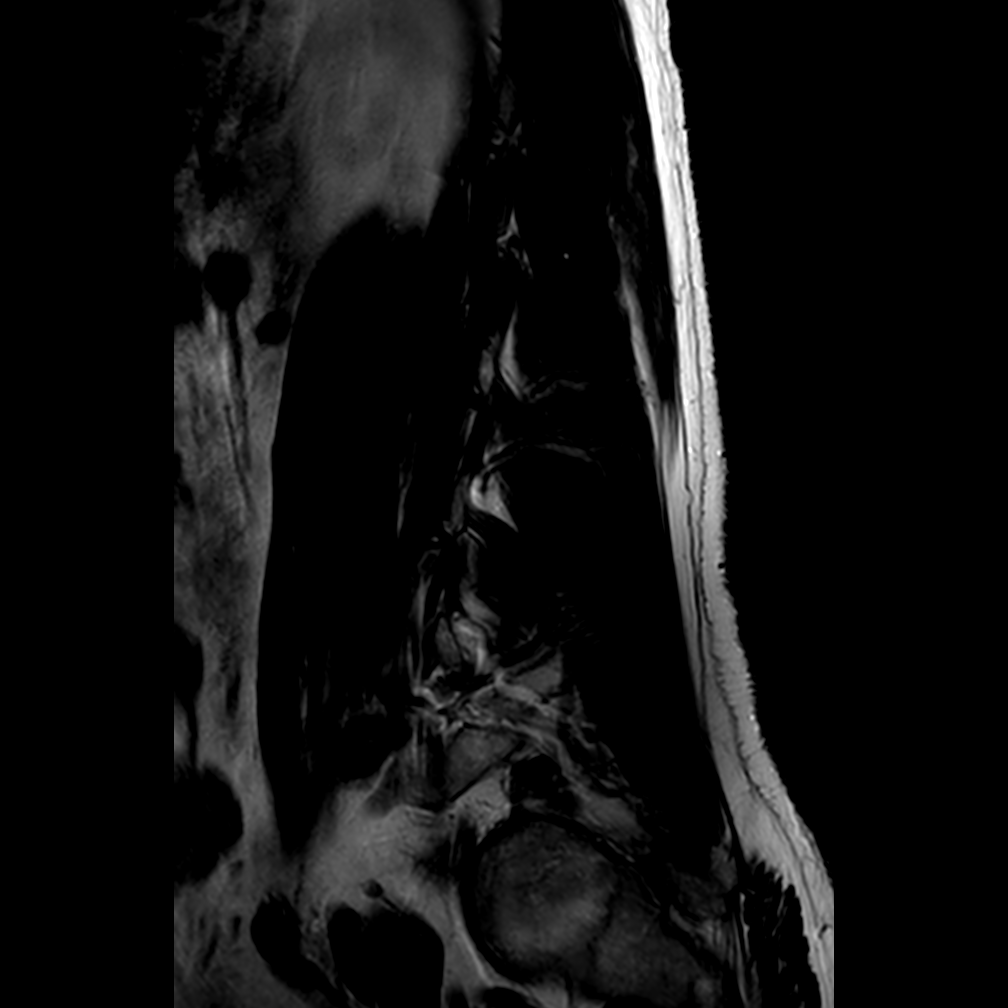

[8 of 48 positions shown; findings below may reference images not displayed]

FINDINGS: -------------------------------------------------------------------------------- 
------ 
GENERAL: 
Nomenclature is based on 5 lumbar type vertebral bodies.     
ALIGNMENT: Mild anterolisthesis of L4 on L5. 
VERTEBRAL BODY HEIGHT: Normal.  
MARROW SIGNAL: Schmorls node along the superior endplate of L3. Small Schmorls 
node with surrounding edema at the L2 inferior endplate towards the right side. 
CORD SIGNAL: Normal distal spinal cord and cauda equina.  Conus terminates at 
L1. 
ADDITIONAL FINDINGS: Mild fluid between the spinous processes of L3-L4. 
Modic I-II: None. 
Ligamentum Flavum > 2.5 mm: All levels. 
-------------------------------------------------------------------------------- 
------ 
SEGMENTAL: 
T12-L1: No significant central canal narrowing.  No significant right neural 
foraminal narrowing. No significant left neural foraminal narrowing.  
L1-L2: No significant central canal narrowing.  No significant right neural 
foraminal narrowing. No significant left neural foraminal narrowing.  
L2-L3: No significant central canal narrowing.  No significant right neural 
foraminal narrowing. No significant left neural foraminal narrowing.  
L3-L4: Disc bulge with bilateral facet hypertrophy. Mild central canal 
narrowing. Associated bilateral subarticular recess narrowing.  Mild right 
neural foraminal narrowing. Mild left neural foraminal narrowing.  
L4-L5: Disc bulge with left subarticular disc herniation. Bilateral facet 
hypertrophy. Moderate left-sided central canal narrowing with severe left 
subarticular recess narrowing. Moderate right subarticular recess narrowing.  
Mild right neural foraminal narrowing. Severe left neural foraminal narrowing.  
L5-S1: Disc bulge. Bilateral facet hypertrophy. No significant central canal 
narrowing.  Moderate right neural foraminal narrowing. Mild left neural 
foraminal narrowing.  
-------------------------------------------------------------------------------- 
------
IMPRESSION: Discogenic/degenerative changes as above. 
Severe central canal/subarticular recess narrowing: L4-L5 (left subarticular 
recess) 
Severe neural foraminal narrowing: L4-L5 (left)
